# Patient Record
Sex: Male | Born: 1970 | Race: White | Hispanic: No | Smoking: Current every day smoker
Health system: Southern US, Community
[De-identification: ages and names within clinical notes are randomized; demographics above are authoritative.]

## PROBLEM LIST (undated history)

## (undated) DIAGNOSIS — Z9049 Acquired absence of other specified parts of digestive tract: Secondary | ICD-10-CM

## (undated) HISTORY — DX: Acquired absence of other specified parts of digestive tract: Z90.49

## (undated) HISTORY — PX: APPENDECTOMY: SHX54

## (undated) HISTORY — PX: KNEE SURGERY: SHX244

---

## 2017-08-12 ENCOUNTER — Ambulatory Visit (INDEPENDENT_AMBULATORY_CARE_PROVIDER_SITE_OTHER): Payer: Self-pay | Admitting: Physician Assistant

## 2017-08-12 ENCOUNTER — Encounter: Payer: Self-pay | Admitting: Physician Assistant

## 2017-08-12 VITALS — BP 131/93 | HR 67 | Temp 98.1°F | Resp 16 | Ht 72.0 in | Wt 147.8 lb

## 2017-08-12 DIAGNOSIS — K644 Residual hemorrhoidal skin tags: Secondary | ICD-10-CM

## 2017-08-12 MED ORDER — HYDROCORTISONE ACETATE 25 MG RE SUPP: 25.0000 mg | Freq: Two times a day (BID) | RECTAL | 0 refills | Status: DC

## 2017-08-12 NOTE — Progress Notes (Signed)
    08/12/2017 2:39 PM   DOB: [birth year] / MRN: [id number]  SUBJECTIVE:  Tilden DomeStacy Bacote is a 46 y.o. male presenting for a painful external hemorrhoid.  He has a history of this.  No nausea. Sitting makes the pain worse.   He has No Known Allergies.   He  has a past medical history of History of appendectomy.    He  reports that he has never smoked. He has never used smokeless tobacco. He reports that he does not drink alcohol or use drugs. He  has no sexual activity history on file. The patient  has no past surgical history on file.  His family history is not on file.  Review of Systems  Constitutional: Negative for chills, diaphoresis and fever.  Respiratory: Negative for shortness of breath.   Cardiovascular: Negative for chest pain, orthopnea and leg swelling.  Gastrointestinal: Negative for nausea.  Skin: Negative for rash.  Neurological: Negative for dizziness.    The problem list and medications were reviewed and updated by myself where necessary and exist elsewhere in the encounter.   OBJECTIVE:  BP (!) 131/93 (BP Location: Right Arm, Patient Position: Sitting, Cuff Size: Large)   Pulse 67   Temp 98.1 F (36.7 C) (Oral)   Resp 16   Ht 6' (1.829 m)   Wt 147 lb 12.8 oz (67 kg)   SpO2 97%   BMI 20.05 kg/m   Physical Exam  Cardiovascular: Normal rate and regular rhythm.   Genitourinary:     Musculoskeletal: Normal range of motion.  Neurological: He is alert.    No results found for this or any previous visit (from the past 72 hour(s)).  No results found.  ASSESSMENT AND PLAN:  Kennyth ArnoldStacy was seen today for hemorrhoids.  Diagnoses and all orders for this visit:  External hemorrhoid: No indication to incise today.  Will refer to GI for a more permanent solution given he is symptomatic.   -     hydrocortisone (ANUSOL-HC) 25 MG suppository; Place 1 suppository (25 mg total) rectally 2 (two) times daily. -     Ambulatory referral to Gastroenterology    The  patient is advised to call or return to clinic if he does not see an improvement in symptoms, or to seek the care of the closest emergency department if he worsens with the above plan.   Deliah BostonMichael Dashayla Theissen, MHS, PA-C Primary Care at [facility] 08/12/2017 2:39 PM

## 2018-03-23 ENCOUNTER — Other Ambulatory Visit: Payer: Self-pay

## 2018-03-23 ENCOUNTER — Encounter: Payer: Self-pay | Admitting: Physician Assistant

## 2018-03-23 ENCOUNTER — Ambulatory Visit: Payer: Self-pay | Admitting: Physician Assistant

## 2018-03-23 VITALS — BP 132/82 | HR 69 | Temp 98.0°F | Resp 16 | Ht 71.26 in | Wt 154.0 lb

## 2018-03-23 DIAGNOSIS — Z0289 Encounter for other administrative examinations: Secondary | ICD-10-CM

## 2018-03-23 NOTE — Progress Notes (Signed)
Airline pilotCommercial Driver Medical Examination   Tilden DomeStacy Preston is a 47 y.o. male with a pertinent medical history of no medical history who presents today for a commercial driver fitness determination physical exam. The patient reports no problems today. In the past the patient reports receiving 2 year certificates. He denies focal neurological deficits, vision and hearing changes. He denies the habitual use of benzodiazepines, opioids, amphetamines and denies illicit drug use.   Current medications, family history, allergies, social history reviewed by me and exist elsewhere in the encounter.   Review of Systems  Constitutional: Negative for chills, diaphoresis and fever.  Eyes: Negative.   Respiratory: Negative for cough, hemoptysis, sputum production, shortness of breath and wheezing.   Cardiovascular: Negative for chest pain, orthopnea and leg swelling.  Gastrointestinal: Negative for abdominal pain, blood in stool, constipation, diarrhea, heartburn, melena, nausea and vomiting.  Genitourinary: Negative for dysuria, flank pain, frequency, hematuria and urgency.  Skin: Negative for rash.  Neurological: Negative for dizziness, sensory change, speech change, focal weakness and headaches.    Objective:     Vision/hearing:  Visual Acuity Screening   Right eye Left eye Both eyes  Without correction:     With correction: 20/20 20/20 20/20     Applicant can recognize and distinguish among traffic control signals and devices showing standard red, green, and amber colors.  Corrective lenses required: Yes  Monocular Vision?: No  Hearing aid requirement: No  Physical Exam  Constitutional: He is oriented to person, place, and time. He appears well-developed. He is active.  Non-toxic appearance. He does not appear ill.  HENT:  Right Ear: Hearing, tympanic membrane, external ear and ear canal normal.  Left Ear: Hearing, tympanic membrane, external ear and ear canal normal.  Nose: Nose normal.  Right sinus exhibits no maxillary sinus tenderness and no frontal sinus tenderness. Left sinus exhibits no maxillary sinus tenderness and no frontal sinus tenderness.  Mouth/Throat: Uvula is midline, oropharynx is clear and moist and mucous membranes are normal. No oropharyngeal exudate, posterior oropharyngeal edema or tonsillar abscesses.  Eyes: Pupils are equal, round, and reactive to light. Conjunctivae and EOM are normal.  Cardiovascular: Normal rate, regular rhythm, S1 normal, S2 normal, normal heart sounds, intact distal pulses and normal pulses. Exam reveals no gallop and no friction rub.  No murmur heard. Pulmonary/Chest: Effort normal. No stridor. No respiratory distress. He has no wheezes. He has no rales.  Abdominal: Soft. Normal appearance and bowel sounds are normal. He exhibits no distension and no mass. There is no tenderness. There is no rigidity, no rebound, no guarding and no CVA tenderness. No hernia.  Musculoskeletal: Normal range of motion. He exhibits no edema.  Lymphadenopathy:       Head (right side): No submandibular and no tonsillar adenopathy present.       Head (left side): No submandibular and no tonsillar adenopathy present.    He has no cervical adenopathy.  Neurological: He is alert and oriented to person, place, and time. He has normal strength and normal reflexes. He is not disoriented. No cranial nerve deficit or sensory deficit. He exhibits normal muscle tone. Coordination and gait normal.  Skin: Skin is warm and dry. He is not diaphoretic. No pallor.  Psychiatric: He has a normal mood and affect. His behavior is normal.  Nursing note and vitals reviewed.   BP 132/82   Pulse 69   Temp 98 F (36.7 C) (Oral)   Resp 16   Ht 5' 11.26" (1.81 m)  Wt 154 lb (69.9 kg)   SpO2 98%   BMI 21.32 kg/m   Labs: Comments: SG: 1.010 Glucose: Neg Protein: Neg Blood: Neg   Assessment:    Healthy male exam.  Meets standards in 46 CFR 391.41;  qualifies for 2 year  certificate.    Plan:    Medical examiners certificate completed and printed. Return as needed.    Deliah Boston, MS, PA-C 8:54 AM, 03/23/2018

## 2018-03-23 NOTE — Patient Instructions (Signed)
     IF you received an x-ray today, you will receive an invoice from  Radiology. Please contact  Radiology at [phone] with questions or concerns regarding your invoice.   IF you received labwork today, you will receive an invoice from [facility]. Please contact [facility] at [phone] with questions or concerns regarding your invoice.   Our billing staff will not be able to assist you with questions regarding bills from these companies.  You will be contacted with the lab results as soon as they are available. The fastest way to get your results is to activate your My Chart account. Instructions are located on the last page of this paperwork. If you have not heard from us regarding the results in 2 weeks, please contact this office.     

## 2018-09-25 ENCOUNTER — Emergency Department (HOSPITAL_BASED_OUTPATIENT_CLINIC_OR_DEPARTMENT_OTHER)
Admission: EM | Admit: 2018-09-25 | Discharge: 2018-09-26 | Disposition: A | Discharge status: Home / Self Care | Payer: Worker's Compensation | Attending: Emergency Medicine | Admitting: Emergency Medicine

## 2018-09-25 ENCOUNTER — Encounter (HOSPITAL_BASED_OUTPATIENT_CLINIC_OR_DEPARTMENT_OTHER): Payer: Self-pay | Admitting: Emergency Medicine

## 2018-09-25 ENCOUNTER — Other Ambulatory Visit: Payer: Self-pay

## 2018-09-25 DIAGNOSIS — S46911A Strain of unspecified muscle, fascia and tendon at shoulder and upper arm level, right arm, initial encounter: Secondary | ICD-10-CM | POA: Diagnosis not present

## 2018-09-25 DIAGNOSIS — S3992XA Unspecified injury of lower back, initial encounter: Secondary | ICD-10-CM | POA: Diagnosis present

## 2018-09-25 DIAGNOSIS — F17210 Nicotine dependence, cigarettes, uncomplicated: Secondary | ICD-10-CM | POA: Insufficient documentation

## 2018-09-25 DIAGNOSIS — Y92410 Unspecified street and highway as the place of occurrence of the external cause: Secondary | ICD-10-CM | POA: Diagnosis not present

## 2018-09-25 DIAGNOSIS — S39012A Strain of muscle, fascia and tendon of lower back, initial encounter: Secondary | ICD-10-CM | POA: Insufficient documentation

## 2018-09-25 DIAGNOSIS — T148XXA Other injury of unspecified body region, initial encounter: Secondary | ICD-10-CM

## 2018-09-25 DIAGNOSIS — Y998 Other external cause status: Secondary | ICD-10-CM | POA: Diagnosis not present

## 2018-09-25 DIAGNOSIS — M62838 Other muscle spasm: Secondary | ICD-10-CM | POA: Diagnosis not present

## 2018-09-25 DIAGNOSIS — S29012A Strain of muscle and tendon of back wall of thorax, initial encounter: Secondary | ICD-10-CM | POA: Diagnosis not present

## 2018-09-25 DIAGNOSIS — Y939 Activity, unspecified: Secondary | ICD-10-CM | POA: Insufficient documentation

## 2018-09-25 DIAGNOSIS — S76911A Strain of unspecified muscles, fascia and tendons at thigh level, right thigh, initial encounter: Secondary | ICD-10-CM | POA: Diagnosis not present

## 2018-09-25 MED ORDER — KETOROLAC TROMETHAMINE 60 MG/2ML IM SOLN
30.0000 mg | Freq: Once | INTRAMUSCULAR | Status: DC
  Filled 2018-09-25: qty 2

## 2018-09-25 MED ORDER — CYCLOBENZAPRINE HCL 10 MG PO TABS: 5.0000 mg | ORAL_TABLET | Freq: Every day | ORAL | 0 refills | Status: AC

## 2018-09-25 NOTE — Discharge Instructions (Signed)
You may use over-the-counter Motrin (Ibuprofen), Acetaminophen (Tylenol), topical muscle creams such as SalonPas, Icy Hot, Bengay, etc. Please stretch, apply heat, and have massage therapy for additional assistance. ° °
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
## 2018-09-25 NOTE — ED Notes (Signed)
Pt. To have UDS done per his job requirements  His supervisor at bedside
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
## 2018-09-29 ENCOUNTER — Ambulatory Visit (INDEPENDENT_AMBULATORY_CARE_PROVIDER_SITE_OTHER): Payer: Worker's Compensation | Admitting: Family Medicine

## 2018-09-29 ENCOUNTER — Ambulatory Visit (HOSPITAL_BASED_OUTPATIENT_CLINIC_OR_DEPARTMENT_OTHER)
Admission: RE | Admit: 2018-09-29 | Discharge: 2018-09-29 | Disposition: A | Discharge status: Home / Self Care | Payer: Worker's Compensation | Source: Ambulatory Visit | Attending: Family Medicine | Admitting: Family Medicine

## 2018-09-29 ENCOUNTER — Encounter: Payer: Self-pay | Admitting: Family Medicine

## 2018-09-29 VITALS — BP 134/95 | HR 72 | Ht 72.0 in | Wt 148.0 lb

## 2018-09-29 DIAGNOSIS — M542 Cervicalgia: Secondary | ICD-10-CM | POA: Insufficient documentation

## 2018-09-29 DIAGNOSIS — M545 Low back pain, unspecified: Secondary | ICD-10-CM

## 2018-09-29 DIAGNOSIS — S86811A Strain of other muscle(s) and tendon(s) at lower leg level, right leg, initial encounter: Secondary | ICD-10-CM | POA: Diagnosis not present

## 2018-09-29 NOTE — Patient Instructions (Addendum)
You have cervical and lumbar strains. Ok to take tylenol for baseline pain relief (1-2 extra strength tabs 3x/day) Aleve 2 tabs twice a day with food for pain and inflammation (if you do not have stomach or kidney issues). Topical capsaicin or aspercreme up to 4 times a day. Heat 15 minutes at a time 3-4 times a day. Stay as active as possible. Do home exercises and stretches as directed - hold each for 20-30 seconds and do each one three times. Consider massage, chiropractor, physical therapy, and/or acupuncture. Physical therapy has been shown to be helpful while the others have mixed results. Strengthening of low back muscles, abdominal musculature are key for long term pain relief. If not improving, will consider further imaging (MRI). Follow up with me in 1 week.  You have a calf strain Compression sleeve or ace wrap to help with swelling and pain if tolerated. Icing for 15 minutes at a time 3-4 times a day Heel lifts either in temporary orthotics or on their own to prevent further strain. Tylenol and/or aleve for pain as noted above. Start ankle range of motion exercises twice a day (up/down and alphabet exercises). When tolerated start theraband strengthening exercises. Advance to two legged calf raises, then one legged, then finally doing these on a step. Follow up with me in 1 week.
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
## 2018-10-06 ENCOUNTER — Encounter: Payer: Self-pay | Admitting: Family Medicine

## 2018-10-06 ENCOUNTER — Ambulatory Visit (INDEPENDENT_AMBULATORY_CARE_PROVIDER_SITE_OTHER): Payer: Worker's Compensation | Admitting: Family Medicine

## 2018-10-06 VITALS — BP 148/95 | HR 76 | Ht 72.0 in | Wt 148.0 lb

## 2018-10-06 DIAGNOSIS — S86811D Strain of other muscle(s) and tendon(s) at lower leg level, right leg, subsequent encounter: Secondary | ICD-10-CM | POA: Diagnosis not present

## 2018-10-06 DIAGNOSIS — M542 Cervicalgia: Secondary | ICD-10-CM

## 2018-10-06 DIAGNOSIS — M545 Low back pain, unspecified: Secondary | ICD-10-CM
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
## 2018-10-06 NOTE — Patient Instructions (Signed)
You have cervical and lumbar strains. Ok to take tylenol for baseline pain relief (1-2 extra strength tabs 3x/day) Aleve 2 tabs twice a day with food for pain and inflammation (if you do not have stomach or kidney issues). Topical capsaicin or aspercreme up to 4 times a day. Heat 15 minutes at a time 3-4 times a day. Stay as active as possible. Do home exercises and stretches as directed - hold each for 20-30 seconds and do each one three times. Start physical therapy for both of these - this is the most important part of treatment. Strengthening of low back muscles, abdominal musculature are key for long term pain relief. If not improving, will consider further imaging (MRI). Follow up with me in 2 weeks.  Your calf is doing well.

## 2018-10-07 NOTE — Addendum Note (Signed)
Addended by: Kathi Simpers F on: 10/07/2018 01:59 PM   Modules accepted: Orders

## 2018-10-20 ENCOUNTER — Encounter: Payer: Self-pay | Admitting: Family Medicine

## 2018-10-20 ENCOUNTER — Ambulatory Visit (INDEPENDENT_AMBULATORY_CARE_PROVIDER_SITE_OTHER): Payer: Worker's Compensation | Admitting: Family Medicine

## 2018-10-20 VITALS — BP 155/96 | HR 62 | Ht 72.0 in | Wt 148.0 lb

## 2018-10-20 DIAGNOSIS — M545 Low back pain, unspecified: Secondary | ICD-10-CM

## 2018-10-20 DIAGNOSIS — M542 Cervicalgia: Secondary | ICD-10-CM

## 2018-10-20 MED ORDER — PREDNISONE 10 MG PO TABS: ORAL_TABLET | ORAL | 0 refills | Status: DC

## 2018-10-20 NOTE — Patient Instructions (Signed)
You have cervical and lumbar strains. Start prednisone dose pack x 6 days.  Don't take aleve again until you're done with the prednisone in 6 days. Ok to take tylenol for baseline pain relief (1-2 extra strength tabs 3x/day) Topical capsaicin or aspercreme up to 4 times a day. Heat 15 minutes at a time 3-4 times a day. Consider robaxin (muscle relaxant). Stay as active as possible. Do home exercises and stretches as directed - hold each for 20-30 seconds and do each one three times. Start physical therapy for both of these - this is the most important part of treatment. Strengthening of low back muscles, abdominal musculature are key for long term pain relief. If not improving, will consider further imaging (MRI). Follow up with me in 2 weeks.
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
## 2018-11-03 ENCOUNTER — Encounter: Payer: Self-pay | Admitting: Family Medicine

## 2018-11-03 ENCOUNTER — Ambulatory Visit (INDEPENDENT_AMBULATORY_CARE_PROVIDER_SITE_OTHER): Payer: Worker's Compensation | Admitting: Family Medicine

## 2018-11-03 VITALS — BP 159/100 | HR 90 | Ht 72.0 in | Wt 154.0 lb

## 2018-11-03 DIAGNOSIS — M545 Low back pain, unspecified: Secondary | ICD-10-CM

## 2018-11-03 DIAGNOSIS — M542 Cervicalgia: Secondary | ICD-10-CM

## 2018-11-03 MED ORDER — PREDNISONE 10 MG PO TABS: ORAL_TABLET | ORAL | 0 refills | Status: AC

## 2018-11-03 NOTE — Patient Instructions (Signed)
Take the extended course of prednisone as directed. We will go ahead with MRIs of your cervical and lumbar spines given you're not improving. You can continue with the physical therapy in the meantime though.

## 2018-11-04 ENCOUNTER — Encounter: Payer: Self-pay | Admitting: Family Medicine
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
## 2018-11-23 ENCOUNTER — Encounter: Payer: Self-pay | Admitting: Family Medicine

## 2018-11-23 ENCOUNTER — Ambulatory Visit (INDEPENDENT_AMBULATORY_CARE_PROVIDER_SITE_OTHER): Payer: Worker's Compensation | Admitting: Family Medicine

## 2018-11-23 VITALS — BP 154/92 | HR 93 | Ht 72.0 in | Wt 155.0 lb

## 2018-11-23 DIAGNOSIS — M545 Low back pain, unspecified: Secondary | ICD-10-CM

## 2018-11-23 DIAGNOSIS — M542 Cervicalgia: Secondary | ICD-10-CM

## 2018-11-23 NOTE — Patient Instructions (Signed)
Follow up with me as needed.
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
## 2018-11-27 ENCOUNTER — Encounter: Payer: Self-pay | Admitting: Family Medicine
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
## 2020-02-21 ENCOUNTER — Other Ambulatory Visit: Payer: Self-pay

## 2020-02-21 ENCOUNTER — Encounter: Payer: Self-pay | Admitting: Family Medicine

## 2020-02-21 ENCOUNTER — Ambulatory Visit (INDEPENDENT_AMBULATORY_CARE_PROVIDER_SITE_OTHER): Payer: PRIVATE HEALTH INSURANCE | Admitting: Family Medicine

## 2020-02-21 VITALS — BP 130/80 | HR 94 | Temp 98.2°F | Ht 72.0 in | Wt 149.0 lb

## 2020-02-21 DIAGNOSIS — Z024 Encounter for examination for driving license: Secondary | ICD-10-CM

## 2020-02-21 NOTE — Progress Notes (Signed)
Subjective:  Patient ID: Brett Preston, male    DOB: 24-Mar-1971  Age: 49 y.o. MRN: [id number]  CC:  Chief Complaint  Patient presents with  . DOT Physical    pt feel good today with no complaints.    HPI Brett Preston presents for   DOT physical.  Previous DOT physical March 23, 2018 reviewed, 2-year certificate without concerns.  No change in health since last visit.   No hx of HTN, OSA. No snoring. No daytime somnolence.  No CP/dyspnea with exertion.  Wears glasses to drive. No issues with glare. No glaucoma/aphakia.   No msk weakness, limitations.  Bilat knee surgery - 31yrs ago, no recent issues.     History There are no problems to display for this patient.  Past Medical History:  Diagnosis Date  . History of appendectomy    appx 30 years ago   Past Surgical History:  Procedure Laterality Date  . APPENDECTOMY    . KNEE SURGERY     No Known Allergies Prior to Admission medications   Medication Sig Start Date End Date Taking? Authorizing Provider  predniSONE (DELTASONE) 10 MG tablet 6 tabs po days 1-2, 5 tabs po days 3-4, 4 tabs po days 5-6, 3 tabs po days 7-8, 2 tabs po days 9-10, 1 tab po days 11-12 Patient not taking: Reported on 02/21/2020 11/03/18   Dene Gentry, MD   Social History   Socioeconomic History  . Marital status: Divorced    Spouse name: Not on file  . Number of children: Not on file  . Years of education: Not on file  . Highest education level: Not on file  Occupational History  . Not on file  Tobacco Use  . Smoking status: Current Every Day Smoker    Packs/day: 0.03    Types: Cigarettes  . Smokeless tobacco: Never Used  . Tobacco comment: 3 cigarettes a day  Substance and Sexual Activity  . Alcohol use: No  . Drug use: No  . Sexual activity: Not on file  Other Topics Concern  . Not on file  Social History Narrative  . Not on file   Social Determinants of Health   Financial Resource Strain:   . Difficulty of Paying  Living Expenses:   Food Insecurity:   . Worried About Charity fundraiser in the Last Year:   . Arboriculturist in the Last Year:   Transportation Needs:   . Film/video editor (Medical):   Marland Kitchen Lack of Transportation (Non-Medical):   Physical Activity:   . Days of Exercise per Week:   . Minutes of Exercise per Session:   Stress:   . Feeling of Stress :   Social Connections:   . Frequency of Communication with Friends and Family:   . Frequency of Social Gatherings with Friends and Family:   . Attends Religious Services:   . Active Member of Clubs or Organizations:   . Attends Archivist Meetings:   Marland Kitchen Marital Status:   Intimate Partner Violence:   . Fear of Current or Ex-Partner:   . Emotionally Abused:   Marland Kitchen Physically Abused:   . Sexually Abused:     Review of Systems Per DOT form - no positive responses  Objective:   Vitals:   02/21/20 1458 02/21/20 1508  BP: (!) 180/108 130/80  Pulse: 94   Temp: 98.2 F (36.8 C)   TempSrc: Temporal   SpO2: 97%   Weight: 149 lb (67.6 kg)  Height: 6' (1.829 m)      Physical Exam Vitals reviewed.  Constitutional:      Appearance: He is well-developed.  HENT:     Head: Normocephalic and atraumatic.     Right Ear: External ear normal.     Left Ear: External ear normal.  Eyes:     Conjunctiva/sclera: Conjunctivae normal.     Pupils: Pupils are equal, round, and reactive to light.  Neck:     Thyroid: No thyromegaly.  Cardiovascular:     Rate and Rhythm: Normal rate and regular rhythm.     Heart sounds: Normal heart sounds.  Pulmonary:     Effort: Pulmonary effort is normal. No respiratory distress.     Breath sounds: Normal breath sounds. No wheezing.  Abdominal:     General: There is no distension.     Palpations: Abdomen is soft.     Tenderness: There is no abdominal tenderness.     Hernia: There is no hernia in the left inguinal area or right inguinal area.  Musculoskeletal:        General: No tenderness.  Normal range of motion.     Cervical back: Normal range of motion and neck supple.  Lymphadenopathy:     Cervical: No cervical adenopathy.  Skin:    General: Skin is warm and dry.  Neurological:     Mental Status: He is alert and oriented to person, place, and time.     Deep Tendon Reflexes: Reflexes are normal and symmetric.  Psychiatric:        Behavior: Behavior normal.        Assessment & Plan:  Brett Preston is a 49 y.o. male . Encounter for commercial driver medical examination (CDME)  - no concerns on exam/hx. 2 year card provided. Trace protein on u/a - likely volume related - recheck with PCP when well hydrated.   No orders of the defined types were placed in this encounter.  Patient Instructions       If you have lab work done today you will be contacted with your lab results within the next 2 weeks.  If you have not heard from [place] then please contact us. The fastest way to get your results is to register for My Chart.   IF you received an x-ray today, you will receive an invoice from [facility]. Please contact [facility] at [phone] with questions or concerns regarding your invoice.   IF you received labwork today, you will receive an invoice from [place]. Please contact [facility] at [phone] with questions or concerns regarding your invoice.   Our billing staff will not be able to assist you with questions regarding bills from these companies.  You will be contacted with the lab results as soon as they are available. The fastest way to get your results is to activate your My Chart account. Instructions are located on the last page of this paperwork. If you have not heard from [place] regarding the results in 2 weeks, please contact this office.    Trace protein noted on urine test - recheck with primary care provider when well hydrated.      Signed, Meredith Staggers, MD [facility]

## 2020-02-21 NOTE — Patient Instructions (Addendum)
     If you have lab work done today you will be contacted with your lab results within the next 2 weeks.  If you have not heard from [place] then please contact us. The fastest way to get your results is to register for My Chart.   IF you received an x-ray today, you will receive an invoice from [facility]. Please contact [facility] at [phone] with questions or concerns regarding your invoice.   IF you received labwork today, you will receive an invoice from [place]. Please contact [facility] at [phone] with questions or concerns regarding your invoice.   Our billing staff will not be able to assist you with questions regarding bills from these companies.  You will be contacted with the lab results as soon as they are available. The fastest way to get your results is to activate your My Chart account. Instructions are located on the last page of this paperwork. If you have not heard from [place] regarding the results in 2 weeks, please contact this office.    Trace protein noted on urine test - recheck with primary care provider when well hydrated.
